# Patient Record
Sex: Male | Born: 2000 | Race: White | Hispanic: No | Marital: Single | Smoking: Never smoker
Health system: Southern US, Community
[De-identification: ages and names within clinical notes are randomized; demographics above are authoritative.]

## PROBLEM LIST (undated history)

## (undated) DIAGNOSIS — J302 Other seasonal allergic rhinitis: Secondary | ICD-10-CM

## (undated) DIAGNOSIS — K4090 Unilateral inguinal hernia, without obstruction or gangrene, not specified as recurrent: Secondary | ICD-10-CM

---
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
## 2023-07-19 ENCOUNTER — Encounter: Payer: Self-pay | Admitting: Advanced Practice Midwife
